# Patient Record
Sex: Male | Born: 1965 | Race: White | Hispanic: No | Marital: Married | Smoking: Current every day smoker
Health system: Southern US, Community
[De-identification: ages and names within clinical notes are randomized; demographics above are authoritative.]

## PROBLEM LIST (undated history)

## (undated) DIAGNOSIS — G4730 Sleep apnea, unspecified: Secondary | ICD-10-CM

## (undated) DIAGNOSIS — I10 Essential (primary) hypertension: Secondary | ICD-10-CM

## (undated) HISTORY — DX: Sleep apnea, unspecified: G47.30

## (undated) HISTORY — DX: Essential (primary) hypertension: I10

## (undated) HISTORY — PX: VASECTOMY: SHX75

---

## 2006-04-02 ENCOUNTER — Encounter: Admission: RE | Admit: 2006-04-02 | Discharge: 2006-04-02 | Payer: Self-pay | Admitting: Internal Medicine

## 2012-05-29 ENCOUNTER — Ambulatory Visit (INDEPENDENT_AMBULATORY_CARE_PROVIDER_SITE_OTHER): Payer: PRIVATE HEALTH INSURANCE | Admitting: Physician Assistant

## 2012-05-29 VITALS — BP 135/84 | HR 94 | Temp 97.4°F | Resp 20 | Ht 72.0 in | Wt 245.0 lb

## 2012-05-29 DIAGNOSIS — L03319 Cellulitis of trunk, unspecified: Secondary | ICD-10-CM

## 2012-05-29 DIAGNOSIS — L02212 Cutaneous abscess of back [any part, except buttock]: Secondary | ICD-10-CM

## 2012-05-29 DIAGNOSIS — L723 Sebaceous cyst: Secondary | ICD-10-CM

## 2012-05-29 MED ORDER — DOXYCYCLINE HYCLATE 100 MG PO CAPS: 100.0000 mg | ORAL_CAPSULE | Freq: Two times a day (BID) | ORAL | Status: DC

## 2012-05-29 NOTE — Progress Notes (Signed)
  Subjective:    Patient ID: Kenneth Koch, male    DOB: 02/10/1961, 47 y.o.   MRN: [id number]  HPI 47 year old male presents with 3-4 day history of irritated bump on his back.  States he has had a bump there for "years" but this week it has becoming increasingly irritated and painful.  Denies any drainage or warmth.  No fevers, chills, nausea, or vomiting. He is otherwise healthy with no other concerns today.      Review of Systems  Constitutional: Negative for fever and chills.  Gastrointestinal: Negative for nausea and vomiting.  Skin: Positive for wound.  Neurological: Negative for headaches.       Objective:   Physical Exam  Constitutional: He is oriented to person, place, and time. He appears well-developed and well-nourished.  HENT:  Head: Normocephalic and atraumatic.  Right Ear: External ear normal.  Left Ear: External ear normal.  Eyes: Conjunctivae are normal.  Cardiovascular: Normal rate.   Pulmonary/Chest: Effort normal.  Neurological: He is alert and oriented to person, place, and time.  Skin:     Psychiatric: He has a normal mood and affect. His behavior is normal. Judgment and thought content normal.      Patient ID: Kenneth Koch MRN: [id number], DOB: 02/10/1961, 47 y.o. Date of Encounter: 05/29/2012, 5:37 PM    PROCEDURE NOTE: Verbal consent obtained. Local anesthesia obtained with 2% lidocaine plain.  1 cm incision made with 11 blade along lesion.  Culture taken. Minimal purulence expressed. Copious sebaceous material expressed. Capsule removed.   Lesion explored revealing no loculations. Packed with 1/4 plain packing. Dressed. Wound care instructions including precautions with patient. Patient tolerated the procedure well. Recheck in 48 hours.       Grier Mitts, PA-C 05/29/2012 5:37 PM     Assessment & Plan:  1. Abscess of back - Plan: Wound culture, doxycycline (VIBRAMYCIN) 100 MG capsule  -Culture pending  -Doxycycline 100  mg bid x 10 days 2. Sebaceous cyst  -Placed on doxycycline due to minimal amount of purulence expressed  -Likely will only need 1 packing change but will determine based on recheck in 48 hours.

## 2012-05-31 ENCOUNTER — Ambulatory Visit (INDEPENDENT_AMBULATORY_CARE_PROVIDER_SITE_OTHER): Payer: PRIVATE HEALTH INSURANCE | Admitting: Physician Assistant

## 2012-05-31 ENCOUNTER — Encounter: Payer: Self-pay | Admitting: Physician Assistant

## 2012-05-31 VITALS — BP 153/102 | HR 84 | Temp 97.4°F | Resp 16 | Ht 72.0 in | Wt 246.0 lb

## 2012-05-31 DIAGNOSIS — L723 Sebaceous cyst: Secondary | ICD-10-CM

## 2012-05-31 NOTE — Progress Notes (Signed)
   Patient ID: Kenneth Koch MRN: [id number], DOB: May 05, 1965 48 y.o. Date of Encounter: 05/31/2012, 10:27 AM  Primary Physician: No primary Thea Holshouser on file.  Chief Complaint: Wound care   See previous note  HPI: 47 y.o. y/o male presents for wound care s/p I&D on 05/29/12 Doing well No issues or complaints Afebrile/ no chills No nausea or vomiting Tolerating Doxycycline Pain resolved "I have not even needed an ibuprofen." Daily dressing change Previous note reviewed  History reviewed. No pertinent past medical history.   Home Meds: Prior to Admission medications   Medication Sig Start Date End Date Taking? Authorizing Audra Kagel  doxycycline (VIBRAMYCIN) 100 MG capsule Take 1 capsule (100 mg total) by mouth 2 (two) times daily. 05/29/12  Yes Nelva Nay, PA-C    Allergies: No Known Allergies  ROS: Constitutional: Afebrile, no chills Cardiovascular: negative for chest pain or palpitations Dermatological: Positive for wound and erythema. Negative for pain GI: No nausea or vomiting   EXAM: Physical Exam: Blood pressure 153/102, pulse 84, temperature 97.4 F (36.3 C), temperature source Oral, resp. rate 16, height 6' (1.829 m), weight 246 lb (111.585 kg)., Body mass index is 33.36 kg/(m^2). General: Well developed, well nourished, in no acute distress. Nontoxic appearing. Head: Normocephalic, atraumatic, sclera non-icteric.  Neck: Supple. Lungs: Breathing is unlabored. Heart: Normal rate. Skin:  Warm and moist. Dressing and packing in place. Mild local induration and erythema without tenderness to palpation. Neuro: Alert and oriented X 3. Moves all extremities spontaneously. Normal gait.  Psych:  Responds to questions appropriately with a normal affect.   PROCEDURE: Dressing and packing removed. No purulence expressed Small amount of sebaceous material expressed Wound bed healthy Irrigated with 1% plain lidocaine 5 cc. Repacked with 1/4 inch plain packing    Dressing applied  LAB: Culture: preliminary, GPC in pairs   A/P: 47 y.o. male with cellulitis/abscess as above s/p I&D on 05/29/12 -Wound care per above -Continue Doxycycline -Pain well controlled -Daily dressing changes -Recheck 48 hours  Signed, Eula Listen, PA-C 05/31/2012 10:27 AM

## 2012-06-01 LAB — WOUND CULTURE: Gram Stain: NONE SEEN

## 2012-06-02 ENCOUNTER — Ambulatory Visit: Payer: PRIVATE HEALTH INSURANCE | Admitting: Physician Assistant

## 2012-06-02 VITALS — BP 120/88 | HR 99 | Temp 97.5°F | Resp 16 | Ht 72.0 in | Wt 245.0 lb

## 2012-06-02 NOTE — Progress Notes (Signed)
  Subjective:    Patient ID: Kenneth Koch, male    DOB: 09/30/1965, 47 y.o.   MRN: [id number]  HPI   Kenneth Koch is a very pleasant 47 yr old male here for follow-up on infected sebaceous cyst that was drained here 05/29/12.  Previous notes reviewed.  Pt states he is doing very well.  No discomfort.  Minimal drainage throughout the day.  He actually did not even have a dressing in place today.  Tolerating the antibiotic well.  No fever, chills.      Review of Systems  All other systems reviewed and are negative.       Objective:   Physical Exam  Vitals reviewed. Constitutional: He is oriented to person, place, and time. He appears well-developed and well-nourished. No distress.  HENT:  Head: Normocephalic and atraumatic.  Eyes: Conjunctivae are normal. No scleral icterus.  Pulmonary/Chest: Effort normal.  Neurological: He is alert and oriented to person, place, and time.  Skin: Skin is warm and dry.     Healing wound on left side of back  Psychiatric: He has a normal mood and affect. His behavior is normal.     Filed Vitals:   06/02/12 1820  BP: 120/88  Pulse: 99  Temp: 97.5 F (36.4 C)  Resp: 16     Wound Care: Packing removed, saturated with blood.  Only able to express a small amount of blood from the wound.  No purulence or sebaceous material expressed.  Wound healing well, very shallow, wound bed healthy.  I have not repacked.  Bandage applied.       Assessment & Plan:  Sebaceous cyst   Kenneth Koch is a very pleasant 47 yr old male here for f/u of sebaceous cyst drained her 05/29/12.  Pt is healing well.  I have not repacked the wound as it is shallow and healing well.  Dry dressing to wound daily until healed.  Encouraged pt to finish antibiotic, although wound cx grew multiple organisms, none predominant.  No need for further follow-up unless pt develops concerns.  Pt expressed understanding.

## 2012-09-30 NOTE — Progress Notes (Signed)
This encounter was created in error - please disregard.

## 2015-01-09 ENCOUNTER — Emergency Department (HOSPITAL_COMMUNITY): Payer: PRIVATE HEALTH INSURANCE

## 2015-01-09 ENCOUNTER — Emergency Department (HOSPITAL_COMMUNITY)
Admission: EM | Admit: 2015-01-09 | Discharge: 2015-01-10 | Disposition: A | Discharge status: Home / Self Care | Payer: PRIVATE HEALTH INSURANCE | Attending: Emergency Medicine | Admitting: Emergency Medicine

## 2015-01-09 ENCOUNTER — Encounter (HOSPITAL_COMMUNITY): Payer: Self-pay | Admitting: Emergency Medicine

## 2015-01-09 DIAGNOSIS — Z79899 Other long term (current) drug therapy: Secondary | ICD-10-CM | POA: Diagnosis not present

## 2015-01-09 DIAGNOSIS — R55 Syncope and collapse: Secondary | ICD-10-CM | POA: Insufficient documentation

## 2015-01-09 DIAGNOSIS — Z792 Long term (current) use of antibiotics: Secondary | ICD-10-CM | POA: Insufficient documentation

## 2015-01-09 DIAGNOSIS — Z720 Tobacco use: Secondary | ICD-10-CM | POA: Insufficient documentation

## 2015-01-09 DIAGNOSIS — J40 Bronchitis, not specified as acute or chronic: Secondary | ICD-10-CM | POA: Insufficient documentation

## 2015-01-09 DIAGNOSIS — Z23 Encounter for immunization: Secondary | ICD-10-CM | POA: Diagnosis not present

## 2015-01-09 DIAGNOSIS — S0003XA Contusion of scalp, initial encounter: Secondary | ICD-10-CM | POA: Insufficient documentation

## 2015-01-09 DIAGNOSIS — Y998 Other external cause status: Secondary | ICD-10-CM | POA: Diagnosis not present

## 2015-01-09 DIAGNOSIS — S80212A Abrasion, left knee, initial encounter: Secondary | ICD-10-CM | POA: Insufficient documentation

## 2015-01-09 DIAGNOSIS — Y9289 Other specified places as the place of occurrence of the external cause: Secondary | ICD-10-CM | POA: Insufficient documentation

## 2015-01-09 DIAGNOSIS — Y9389 Activity, other specified: Secondary | ICD-10-CM | POA: Insufficient documentation

## 2015-01-09 DIAGNOSIS — R05 Cough: Secondary | ICD-10-CM

## 2015-01-09 DIAGNOSIS — R058 Other specified cough: Secondary | ICD-10-CM

## 2015-01-09 DIAGNOSIS — W1839XA Other fall on same level, initial encounter: Secondary | ICD-10-CM | POA: Insufficient documentation

## 2015-01-09 DIAGNOSIS — S0990XA Unspecified injury of head, initial encounter: Secondary | ICD-10-CM | POA: Diagnosis not present

## 2015-01-09 LAB — BASIC METABOLIC PANEL
ANION GAP: 15 (ref 5–15)
BUN: 12 mg/dL (ref 6–20)
CALCIUM: 9.2 mg/dL (ref 8.9–10.3)
CHLORIDE: 96 mmol/L — AB (ref 101–111)
CO2: 22 mmol/L (ref 22–32)
Creatinine, Ser: 1.02 mg/dL (ref 0.61–1.24)
GFR calc non Af Amer: >60 mL/min (ref >60)
Glucose, Bld: 93 mg/dL (ref 65–99)
POTASSIUM: 3.6 mmol/L (ref 3.5–5.1)
Sodium: 133 mmol/L — ABNORMAL LOW (ref 135–145)

## 2015-01-09 LAB — URINALYSIS, ROUTINE W REFLEX MICROSCOPIC
BILIRUBIN URINE: NEGATIVE
Glucose, UA: NEGATIVE mg/dL
KETONES UR: NEGATIVE mg/dL
Leukocytes, UA: NEGATIVE
NITRITE: NEGATIVE
PH: 5.5 (ref 5.0–8.0)
Protein, ur: NEGATIVE mg/dL
SPECIFIC GRAVITY, URINE: 1.014 (ref 1.005–1.030)
UROBILINOGEN UA: 0.2 mg/dL (ref 0.0–1.0)

## 2015-01-09 LAB — CBC
HCT: 45 % (ref 39.0–52.0)
HEMOGLOBIN: 16 g/dL (ref 13.0–17.0)
MCH: 32.3 pg (ref 26.0–34.0)
MCHC: 35.6 g/dL (ref 30.0–36.0)
MCV: 90.9 fL (ref 78.0–100.0)
PLATELETS: 309 10*3/uL (ref 150–400)
RBC: 4.95 MIL/uL (ref 4.22–5.81)
RDW: 12.3 % (ref 11.5–15.5)
WBC: 10.1 10*3/uL (ref 4.0–10.5)

## 2015-01-09 LAB — URINE MICROSCOPIC-ADD ON

## 2015-01-09 NOTE — ED Notes (Addendum)
Pt. reports brief syncopal episode / fall this evening , presents with posterior neck pain , headache , left shoulder pain and left scalp swelling/redness . Alert and oriented , speech clear , equal strong grips /no arm drift . C- collar applied at triage . Pt. also reported productive cough /chest congestion onset this week .

## 2015-01-09 NOTE — ED Notes (Signed)
Triage nurse consulted EDP on pt.'s condition - CT scans ordered. Plan of care explained to pt. , delay and waiting time .

## 2015-01-10 ENCOUNTER — Emergency Department (HOSPITAL_COMMUNITY): Payer: PRIVATE HEALTH INSURANCE

## 2015-01-10 LAB — TROPONIN I: Troponin I: <0.03 ng/mL (ref <0.031)

## 2015-01-10 MED ORDER — TETANUS-DIPHTH-ACELL PERTUSSIS 5-2.5-18.5 LF-MCG/0.5 IM SUSP
0.5000 mL | Freq: Once | INTRAMUSCULAR | Status: AC
  Administered 2015-01-10: 0.5 mL via INTRAMUSCULAR
  Filled 2015-01-10: qty 0.5

## 2015-01-10 MED ORDER — ALBUTEROL SULFATE HFA 108 (90 BASE) MCG/ACT IN AERS: 2.0000 | INHALATION_SPRAY | RESPIRATORY_TRACT | Status: DC | PRN

## 2015-01-10 MED ORDER — OXYCODONE-ACETAMINOPHEN 5-325 MG PO TABS
1.0000 | ORAL_TABLET | Freq: Once | ORAL | Status: AC
  Administered 2015-01-10: 1 via ORAL
  Filled 2015-01-10: qty 1

## 2015-01-10 MED ORDER — PREDNISONE 20 MG PO TABS: 40.0000 mg | ORAL_TABLET | Freq: Every day | ORAL | Status: DC

## 2015-01-10 MED ORDER — OXYCODONE-ACETAMINOPHEN 5-325 MG PO TABS: 1.0000 | ORAL_TABLET | Freq: Four times a day (QID) | ORAL | Status: DC | PRN

## 2015-01-10 MED ORDER — ALBUTEROL SULFATE HFA 108 (90 BASE) MCG/ACT IN AERS
2.0000 | INHALATION_SPRAY | RESPIRATORY_TRACT | Status: DC | PRN
  Administered 2015-01-10: 2 via RESPIRATORY_TRACT
  Filled 2015-01-10: qty 6.7

## 2015-01-10 NOTE — Discharge Instructions (Signed)
You were seen today for passing out. You have no evidence of traumatic injury. You may have passed out because of her coughing spell. This is called vasovagal syncope. You do need to follow-up with cardiologist as soon as possible for further evaluation. He likely have bronchitis causing your cough. You'll be given an inhaler and still voids. See return precautions below.   Syncope Syncope is a medical term for fainting or passing out. This means you lose consciousness and drop to the ground. People are generally unconscious for less than 5 minutes. You may have some muscle twitches for up to 15 seconds before waking up and returning to normal. Syncope occurs more often in older adults, but it can happen to anyone. While most causes of syncope are not dangerous, syncope can be a sign of a serious medical problem. It is important to seek medical care.  CAUSES  Syncope is caused by a sudden drop in blood flow to the brain. The specific cause is often not determined. Factors that can bring on syncope include:  Taking medicines that lower blood pressure.  Sudden changes in posture, such as standing up quickly.  Taking more medicine than prescribed.  Standing in one place for too long.  Seizure disorders.  Dehydration and excessive exposure to heat.  Low blood sugar (hypoglycemia).  Straining to have a bowel movement.  Heart disease, irregular heartbeat, or other circulatory problems.  Fear, emotional distress, seeing blood, or severe pain. SYMPTOMS  Right before fainting, you may:  Feel dizzy or light-headed.  Feel nauseous.  See all white or all black in your field of vision.  Have cold, clammy skin. DIAGNOSIS  Your health care provider will ask about your symptoms, perform a physical exam, and perform an electrocardiogram (ECG) to record the electrical activity of your heart. Your health care provider may also perform other heart or blood tests to determine the cause of your  syncope which may include:  Transthoracic echocardiogram (TTE). During echocardiography, sound waves are used to evaluate how blood flows through your heart.  Transesophageal echocardiogram (TEE).  Cardiac monitoring. This allows your health care provider to monitor your heart rate and rhythm in real time.  Holter monitor. This is a portable device that records your heartbeat and can help diagnose heart arrhythmias. It allows your health care provider to track your heart activity for several days, if needed.  Stress tests by exercise or by giving medicine that makes the heart beat faster. TREATMENT  In most cases, no treatment is needed. Depending on the cause of your syncope, your health care provider may recommend changing or stopping some of your medicines. HOME CARE INSTRUCTIONS  Have someone stay with you until you feel stable.  Do not drive, use machinery, or play sports until your health care provider says it is okay.  Keep all follow-up appointments as directed by your health care provider.  Lie down right away if you start feeling like you might faint. Breathe deeply and steadily. Wait until all the symptoms have passed.  Drink enough fluids to keep your urine clear or pale yellow.  If you are taking blood pressure or heart medicine, get up slowly and take several minutes to sit and then stand. This can reduce dizziness. SEEK IMMEDIATE MEDICAL CARE IF:   You have a severe headache.  You have unusual pain in the chest, abdomen, or back.  You are bleeding from your mouth or rectum, or you have black or tarry stool.  You have  an irregular or very fast heartbeat.  You have pain with breathing.  You have repeated fainting or seizure-like jerking during an episode.  You faint when sitting or lying down.  You have confusion.  You have trouble walking.  You have severe weakness.  You have vision problems. If you fainted, call your local emergency services (911 in  [place] Do not drive yourself to the hospital.    This information is not intended to replace advice given to you by your health care provider. Make sure you discuss any questions you have with your health care provider.   Document Released: 03/18/2005 Document Revised: 08/02/2014 Document Reviewed: 05/17/2011 Elsevier Interactive Patient Education 2016 [employer] Acute Bronchitis Bronchitis is inflammation of the airways that extend from the windpipe into the lungs (bronchi). The inflammation often causes mucus to develop. This leads to a cough, which is the most common symptom of bronchitis.  In acute bronchitis, the condition usually develops suddenly and goes away over time, usually in a couple weeks. Smoking, allergies, and asthma can make bronchitis worse. Repeated episodes of bronchitis may cause further lung problems.  CAUSES Acute bronchitis is most often caused by the same virus that causes a cold. The virus can spread from person to person (contagious) through coughing, sneezing, and touching contaminated objects. SIGNS AND SYMPTOMS   Cough.   Fever.   Coughing up mucus.   Body aches.   Chest congestion.   Chills.   Shortness of breath.   Sore throat.  DIAGNOSIS  Acute bronchitis is usually diagnosed through a physical exam. Your health care provider will also ask you questions about your medical history. Tests, such as chest X-rays, are sometimes done to rule out other conditions.  TREATMENT  Acute bronchitis usually goes away in a couple weeks. Oftentimes, no medical treatment is necessary. Medicines are sometimes given for relief of fever or cough. Antibiotic medicines are usually not needed but may be prescribed in certain situations. In some cases, an inhaler may be recommended to help reduce shortness of breath and control the cough. A cool mist vaporizer may also be used to help thin bronchial secretions and make it easier to clear the chest.  HOME CARE  INSTRUCTIONS  Get plenty of rest.   Drink enough fluids to keep your urine clear or pale yellow (unless you have a medical condition that requires fluid restriction). Increasing fluids may help thin your respiratory secretions (sputum) and reduce chest congestion, and it will prevent dehydration.   Take medicines only as directed by your health care provider.  If you were prescribed an antibiotic medicine, finish it all even if you start to feel better.  Avoid smoking and secondhand smoke. Exposure to cigarette smoke or irritating chemicals will make bronchitis worse. If you are a smoker, consider using nicotine gum or skin patches to help control withdrawal symptoms. Quitting smoking will help your lungs heal faster.   Reduce the chances of another bout of acute bronchitis by washing your hands frequently, avoiding people with cold symptoms, and trying not to touch your hands to your mouth, nose, or eyes.   Keep all follow-up visits as directed by your health care provider.  SEEK MEDICAL CARE IF: Your symptoms do not improve after 1 week of treatment.  SEEK IMMEDIATE MEDICAL CARE IF:  You develop an increased fever or chills.   You have chest pain.   You have severe shortness of breath.  You have bloody sputum.   You develop dehydration.  You faint or repeatedly feel like you are going to pass out.  You develop repeated vomiting.  You develop a severe headache. MAKE SURE YOU:   Understand these instructions.  Will watch your condition.  Will get help right away if you are not doing well or get worse.   This information is not intended to replace advice given to you by your health care provider. Make sure you discuss any questions you have with your health care provider.   Document Released: 04/25/2004 Document Revised: 04/08/2014 Document Reviewed: 09/08/2012 Elsevier Interactive Patient Education [employer]

## 2015-01-10 NOTE — ED Provider Notes (Signed)
CSN: [id number]     Arrival date & time 01/09/15  2103 History  By signing my name below, I, Kenneth Koch, attest that this documentation has been prepared under the direction and in the presence of Kenneth Baton, MD. Electronically Signed: Lyndel Koch, ED Scribe. 01/10/2015. 12:18 AM.   Chief Complaint  Patient presents with  . Loss of Consciousness   The history is provided by the patient. No language interpreter was used.   HPI Comments: Kenneth Koch IV is a 49 y.o. male, who is placed in a C-collar, who presents to the Emergency Department  complaining of a syncopal episode that lasted for less than 1 minute PTA. He associates a 5/10 headache. Pt reports he was SOB when coughing and laughing when he lost consciousness and fell hitting his head on a concrete floor. No seizure activity noted. He did not fill dizzy. Patient reports similar syncopal event in the past associated with forcefully coughing and laughing. Pt also reports left shoulder pain and left knee pain attributable to the fall. He was ambulatory after LOC. Pt states he has been taking Nyquil this week for his cough. He is a current daily smoker but denies productivity of his cough. Also denies CP.  Denies fevers. He is not taking blood thinning medication. Denies neck pain.  History reviewed. No pertinent past medical history. Past Surgical History  Procedure Laterality Date  . Vasectomy     Family History  Problem Relation Age of Onset  . Hypertension Mother   . Diabetes Father    Social History  Substance Use Topics  . Smoking status: Current Every Day Smoker  . Smokeless tobacco: None  . Alcohol Use: Yes    Review of Systems  Constitutional: Negative for fever.  Respiratory: Positive for cough.   Cardiovascular: Negative for chest pain and leg swelling.  Gastrointestinal: Negative for nausea, vomiting and abdominal pain.  Skin: Positive for wound.  Neurological: Positive for syncope and headaches.  Negative for light-headedness.  All other systems reviewed and are negative.   Allergies  Review of patient's allergies indicates no known allergies.  Home Medications   Prior to Admission medications   Medication Sig Start Date End Date Taking? Authorizing Provider  albuterol (PROVENTIL HFA;VENTOLIN HFA) 108 (90 BASE) MCG/ACT inhaler Inhale 2 puffs into the lungs every 4 (four) hours as needed for wheezing or shortness of breath. 01/10/15   Kenneth Baton, MD  doxycycline (VIBRAMYCIN) 100 MG capsule Take 1 capsule (100 mg total) by mouth 2 (two) times daily. 05/29/12   Kenneth Nay, PA-C  oxyCODONE-acetaminophen (PERCOCET/ROXICET) 5-325 MG tablet Take 1 tablet by mouth every 6 (six) hours as needed for severe pain. 01/10/15   Kenneth Baton, MD  predniSONE (DELTASONE) 20 MG tablet Take 2 tablets (40 mg total) by mouth daily with breakfast. 01/10/15   Kenneth Baton, MD   BP 139/91 mmHg  Pulse 94  Temp(Src) 97.3 F (36.3 C) (Oral)  Resp 18  Ht 6' (1.829 m)  Wt 263 lb 6 oz (119.466 kg)  BMI 35.71 kg/m2  SpO2 97% Physical Exam  Constitutional: He is oriented to person, place, and time. He appears well-developed and well-nourished. No distress.  HENT:  Mouth/Throat: Oropharynx is clear and moist.  Hematoma noted just left the vertex with tenderness to palpation  Eyes: EOM are normal. Pupils are equal, round, and reactive to light.  Neck: Normal range of motion. Neck supple.  No midline tenderness to palpation, step-off or  deformity  Cardiovascular: Normal rate, regular rhythm and normal heart sounds.   No murmur heard. Pulmonary/Chest: Effort normal and breath sounds normal. No respiratory distress. He exhibits no tenderness.  Scant wheeze  Abdominal: Soft. Bowel sounds are normal. There is no tenderness. There is no rebound.  Musculoskeletal:  Tenderness palpation over the left humeral head, no obvious deformity, normal range of motion, tenderness with range of motion  of the left knee, no swelling or obvious deformity noted  Neurological: He is alert and oriented to person, place, and time.  Skin: Skin is warm and dry.  Abrasion noted to the left knee  Psychiatric: He has a normal mood and affect.  Nursing note and vitals reviewed.   ED Course  Procedures  DIAGNOSTIC STUDIES: Oxygen Saturation is 97% on RA, normal by my interpretation.    COORDINATION OF CARE: 12:16 AM Discussed treatment plan with pt at bedside and pt agreed to plan.   Labs Review Labs Reviewed  BASIC METABOLIC PANEL - Abnormal; Notable for the following:    Sodium 133 (*)    Chloride 96 (*)    All other components within normal limits  URINALYSIS, ROUTINE W REFLEX MICROSCOPIC (NOT AT [facility]) - Abnormal; Notable for the following:    APPearance CLOUDY (*)    Hgb urine dipstick SMALL (*)    All other components within normal limits  CBC  URINE MICROSCOPIC-ADD ON  TROPONIN I    Imaging Review Dg Chest 2 View  01/09/2015   CLINICAL DATA:  Cough and shortness of breath for 10 days. Syncope today.  EXAM: CHEST  2 VIEW  COMPARISON:  None.  FINDINGS: The heart size and mediastinal contours are within normal limits. Both lungs are clear. The visualized skeletal structures are unremarkable.  IMPRESSION: No active cardiopulmonary disease.   Electronically Signed   By: Burman Nieves M.D.   On: 01/09/2015 23:34   Ct Head Wo Contrast  01/09/2015   CLINICAL DATA:  Patient fell off of a bar stool in struck head. Large hematoma to the back of the head. Cold with cough.  EXAM: CT HEAD WITHOUT CONTRAST  CT CERVICAL SPINE WITHOUT CONTRAST  TECHNIQUE: Multidetector CT imaging of the head and cervical spine was performed following the standard protocol without intravenous contrast. Multiplanar CT image reconstructions of the cervical spine were also generated.  COMPARISON:  None.  FINDINGS: CT HEAD FINDINGS  Ventricles and sulci appear symmetrical. No mass effect or midline shift. No abnormal  extra-axial fluid collections. Gray-white matter junctions are distinct. Basal cisterns are not effaced. No evidence of acute intracranial hemorrhage. No depressed skull fractures. Mucosal thickening throughout the paranasal sinuses, most prominent in the left maxillary antrum. Partial opacification of some of the left mastoid air cells.  CT CERVICAL SPINE FINDINGS  Normal alignment of the cervical spine. No vertebral compression deformities. Intervertebral disc space heights are preserved. No focal bone lesion or bone destruction. Bone cortex and trabecular architecture appears intact. C1-2 articulation appears intact. Scattered lymph nodes in the cervical region are not pathologically enlarged.  IMPRESSION: No acute intracranial abnormalities. Normal alignment of the cervical spine. No acute displaced fractures identified.   Electronically Signed   By: Burman Nieves M.D.   On: 01/09/2015 23:44   Ct Cervical Spine Wo Contrast  01/09/2015   CLINICAL DATA:  Patient fell off of a bar stool in struck head. Large hematoma to the back of the head. Cold with cough.  EXAM: CT HEAD WITHOUT CONTRAST  CT CERVICAL  SPINE WITHOUT CONTRAST  TECHNIQUE: Multidetector CT imaging of the head and cervical spine was performed following the standard protocol without intravenous contrast. Multiplanar CT image reconstructions of the cervical spine were also generated.  COMPARISON:  None.  FINDINGS: CT HEAD FINDINGS  Ventricles and sulci appear symmetrical. No mass effect or midline shift. No abnormal extra-axial fluid collections. Gray-white matter junctions are distinct. Basal cisterns are not effaced. No evidence of acute intracranial hemorrhage. No depressed skull fractures. Mucosal thickening throughout the paranasal sinuses, most prominent in the left maxillary antrum. Partial opacification of some of the left mastoid air cells.  CT CERVICAL SPINE FINDINGS  Normal alignment of the cervical spine. No vertebral compression  deformities. Intervertebral disc space heights are preserved. No focal bone lesion or bone destruction. Bone cortex and trabecular architecture appears intact. C1-2 articulation appears intact. Scattered lymph nodes in the cervical region are not pathologically enlarged.  IMPRESSION: No acute intracranial abnormalities. Normal alignment of the cervical spine. No acute displaced fractures identified.   Electronically Signed   By: Burman Nieves M.D.   On: 01/09/2015 23:44   Dg Shoulder Left  01/10/2015   CLINICAL DATA:  Shoulder pain.  EXAM: LEFT SHOULDER - 2+ VIEW  COMPARISON:  None.  FINDINGS: There is no evidence of fracture or dislocation. There is no evidence of arthropathy or other focal bone abnormality. Soft tissues are unremarkable.  IMPRESSION: Negative.   Electronically Signed   By: Burman Nieves M.D.   On: 01/10/2015 00:40   I have personally reviewed and evaluated these images and lab results as part of my medical decision-making.   EKG Interpretation   Date/Time:  Monday January 09 2015 21:35:02 EDT Ventricular Rate:  99 PR Interval:  142 QRS Duration: 98 QT Interval:  366 QTC Calculation: 469 R Axis:   -18 Text Interpretation:  Normal sinus rhythm Incomplete right bundle branch  block Borderline ECG No prior for comparison Confirmed by Cheney Gosch  MD,  Marcel Gary ([id number]) on 01/10/2015 12:09:23 AM      MDM   Final diagnoses:  Vasovagal syncope  Bronchitis    Patient presents with syncope and fall. Syncope in the setting of aggressive coughing and laughing. Reports history of the same. Nontoxic on exam. Contusion to the left scalp, abrasion to the knee and left shoulder pain. Plain films obtained in reassuring.  Given cough and shortness of breath prior to syncope, EKG, troponin obtained and reassuring. Patient is not orthostatic. He was given pain medication. Suspect vasovagal syncope as the cause of the patient's symptoms. He does not have any evidence of traumatic injury.   He has no fevers but is a smoker and has scant wheezing on exam. Will treat for bronchitis. Will also have patient follow-up with cardiology for formal evaluation given her recurrent syncope.  After history, exam, and medical workup I feel the patient has been appropriately medically screened and is Koch for discharge home. Pertinent diagnoses were discussed with the patient. Patient was given return precautions.   I personally performed the services described in this documentation, which was scribed in my presence. The recorded information has been reviewed and is accurate.   Kenneth Baton, MD 01/10/15 [phone]

## 2016-05-28 IMAGING — CT CT CERVICAL SPINE W/O CM
4 of 6 series · 14 of 33 positions shown, 16 images · non-contrast
Comparison: None.

CLINICAL DATA: Patient fell off of a bar stool in struck head.
Large hematoma to the back of the head. Cold with cough.

EXAM:
CT HEAD WITHOUT CONTRAST
CT CERVICAL SPINE WITHOUT CONTRAST
TECHNIQUE: Multidetector CT imaging of the head and cervical spine was
performed following the standard protocol without intravenous
contrast. Multiplanar CT image reconstructions of the cervical spine
were also generated.

[Series 302: soft tissue, idose (2) · axial · 0.39mm/px · z∈[+118,+234]mm · 3 of 117 slices shown]
[im 30/117  soft-tissue]
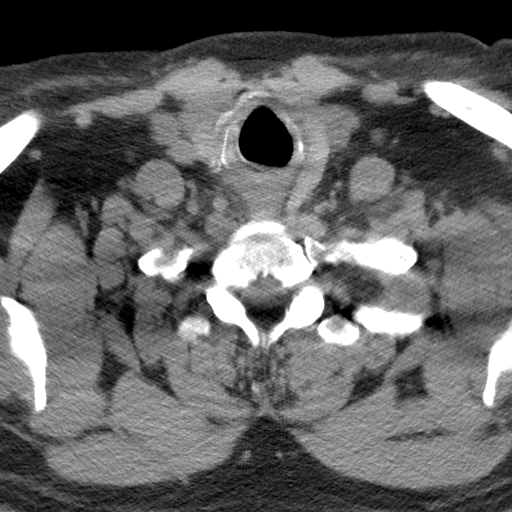
[im 59/117  soft-tissue]
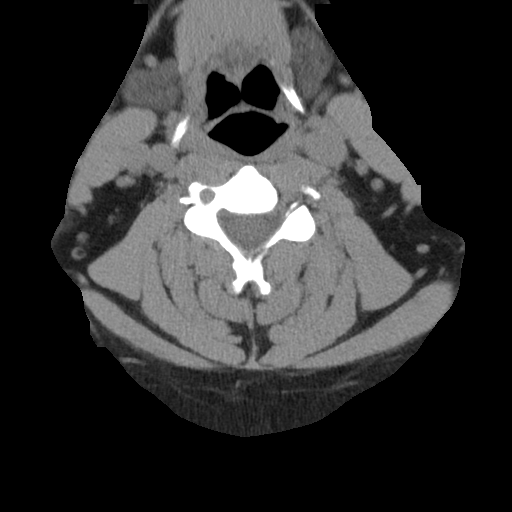
[im 88/117  soft-tissue]
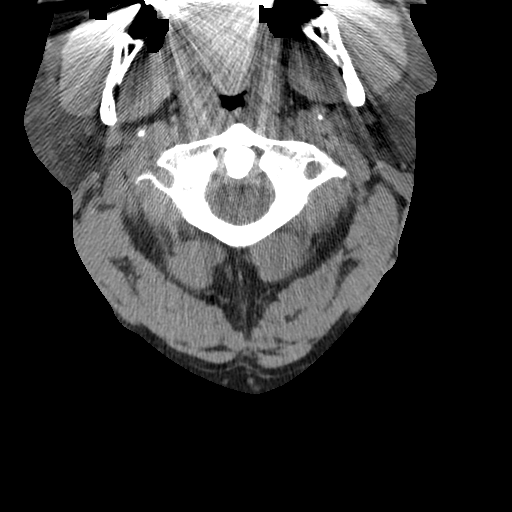

[Series 304: coronal, idose (2) · coronal · 0.37mm/px · 3 of 87 slices shown]
[im 26/87  bone]
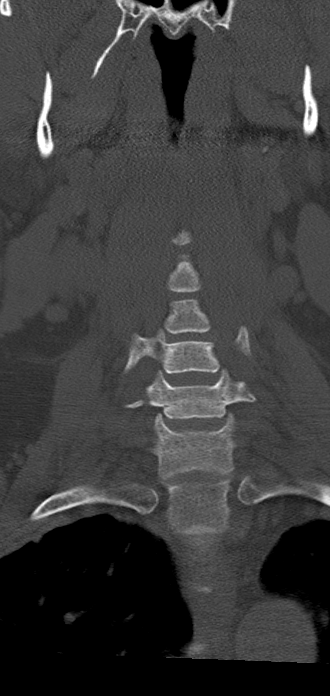
[im 38/87  bone]
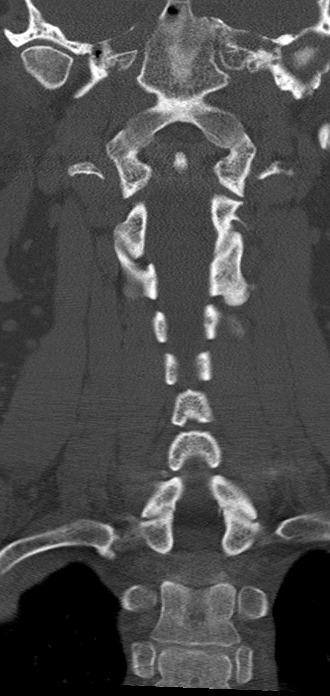
[im 50/87  bone]
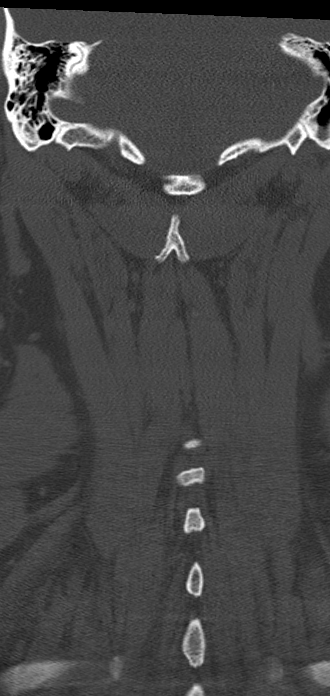

[Series 305: sagittal, idose (2) · sagittal · 0.37mm/px · 5 of 59 slices shown, 6 images]
[im 20/59  bone]
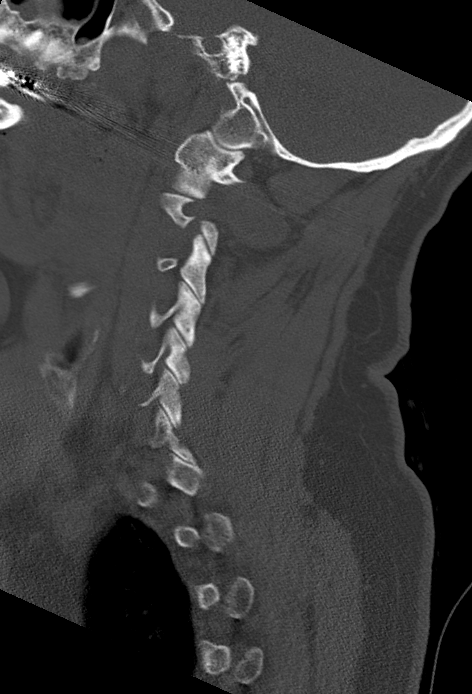
[im 25/59  bone]
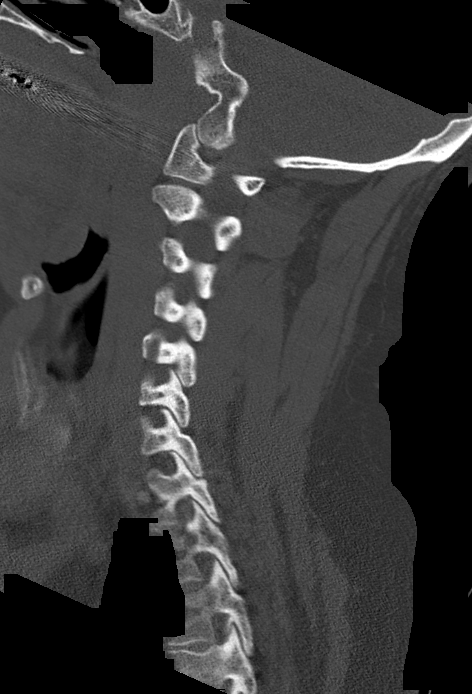
[im 30/59  soft-tissue]
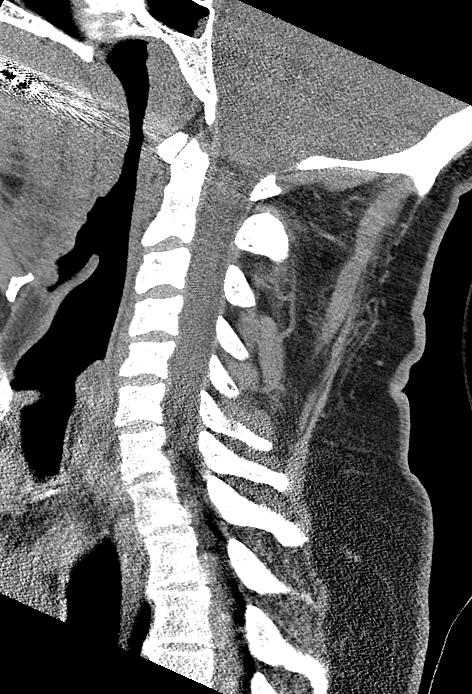
[im 30/59  bone]
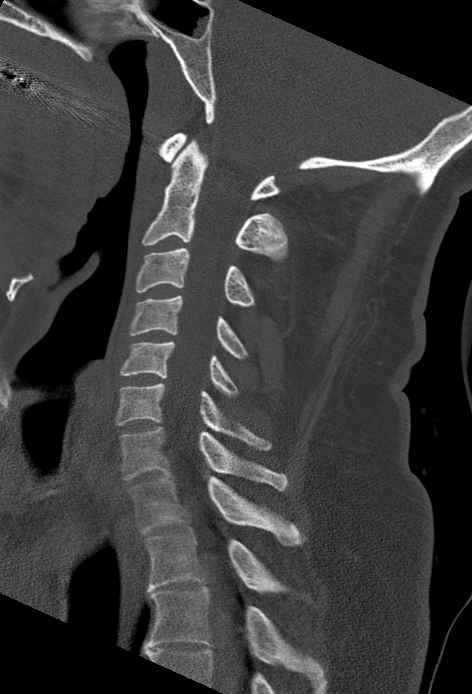
[im 34/59  bone]
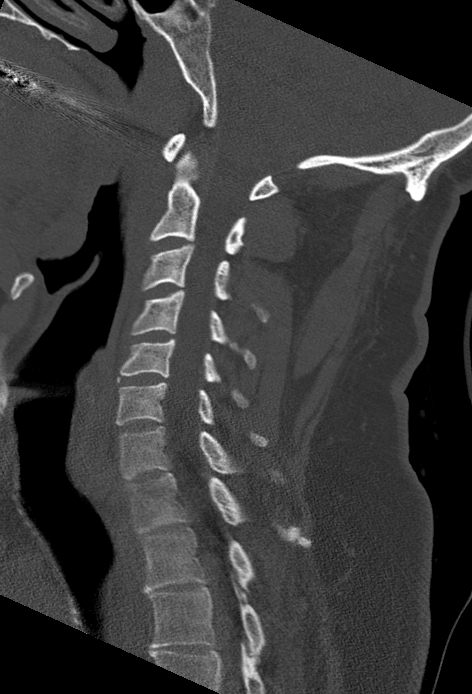
[im 39/59  bone]
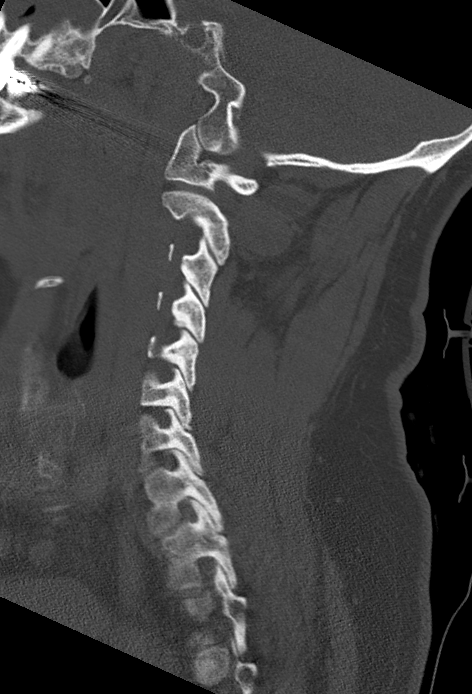

[Series 306: orthogonals, idose (2) · axial · 0.52mm/px · z∈[+80,+197]mm · 3 of 128 slices shown, 4 images]
[im 32/128  soft-tissue]
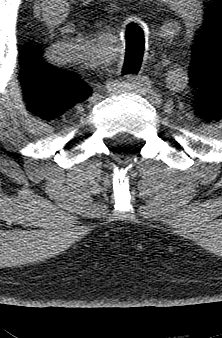
[im 32/128  bone]
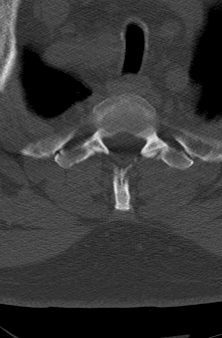
[im 64/128  bone]
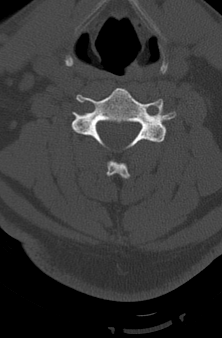
[im 96/128  bone]
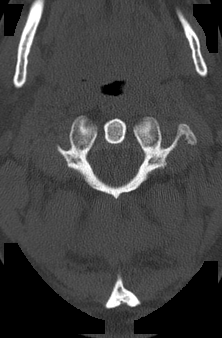

[14 of 33 positions shown; findings below may reference images not displayed]

FINDINGS: CT HEAD FINDINGS

Ventricles and sulci appear symmetrical. No mass effect or midline
shift. No abnormal extra-axial fluid collections. Gray-white matter
junctions are distinct. Basal cisterns are not effaced. No evidence
of acute intracranial hemorrhage. No depressed skull fractures.
Mucosal thickening throughout the paranasal sinuses, most prominent
in the left maxillary antrum. Partial opacification of some of the
left mastoid air cells.

CT CERVICAL SPINE FINDINGS

Normal alignment of the cervical spine. No vertebral compression
deformities. Intervertebral disc space heights are preserved. No
focal bone lesion or bone destruction. Bone cortex and trabecular
architecture appears intact. C1-2 articulation appears intact.
Scattered lymph nodes in the cervical region are not pathologically
enlarged.
IMPRESSION: No acute intracranial abnormalities. Normal alignment of the
cervical spine. No acute displaced fractures identified.

## 2016-05-29 IMAGING — DX DG SHOULDER 2+V*L*
2 series · 2 of 2 positions shown · non-contrast
Comparison: None.

CLINICAL DATA: Shoulder pain.

EXAM:
LEFT SHOULDER - 2+ VIEW

[shoulder grashey]
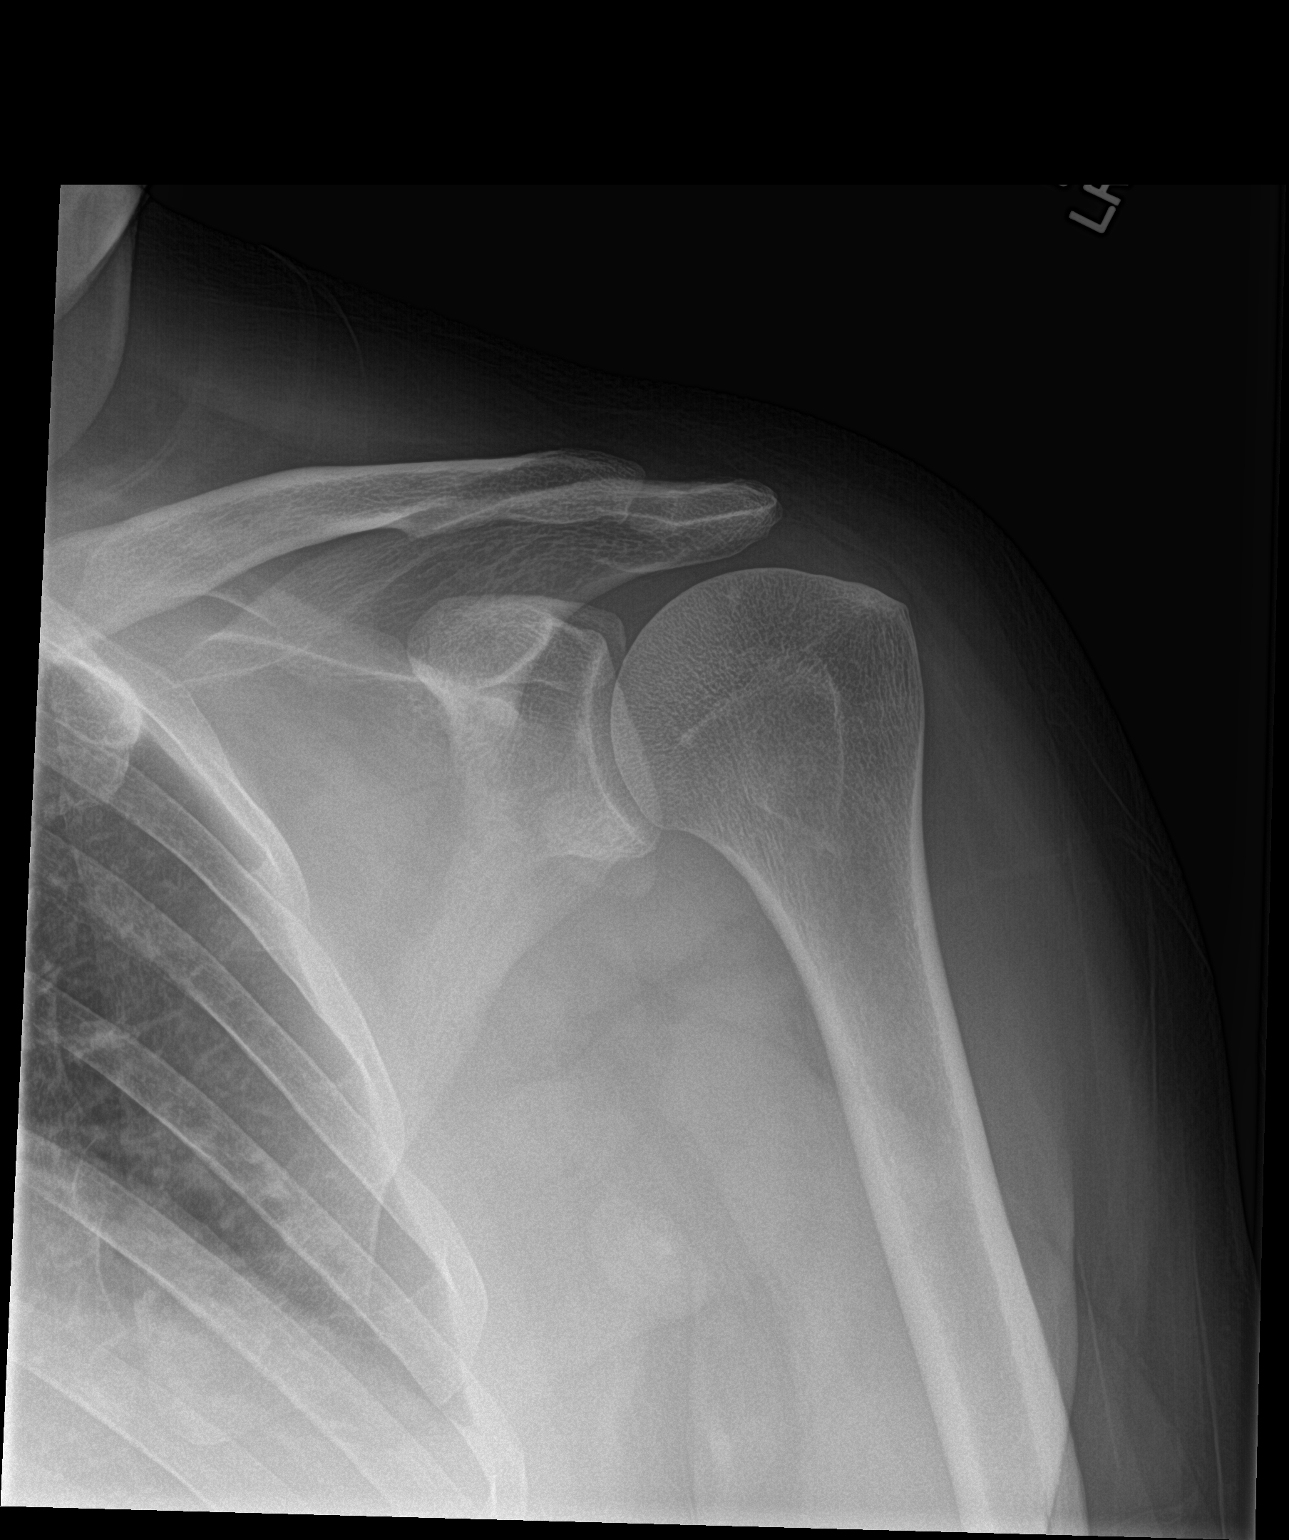

[shoulder y view]
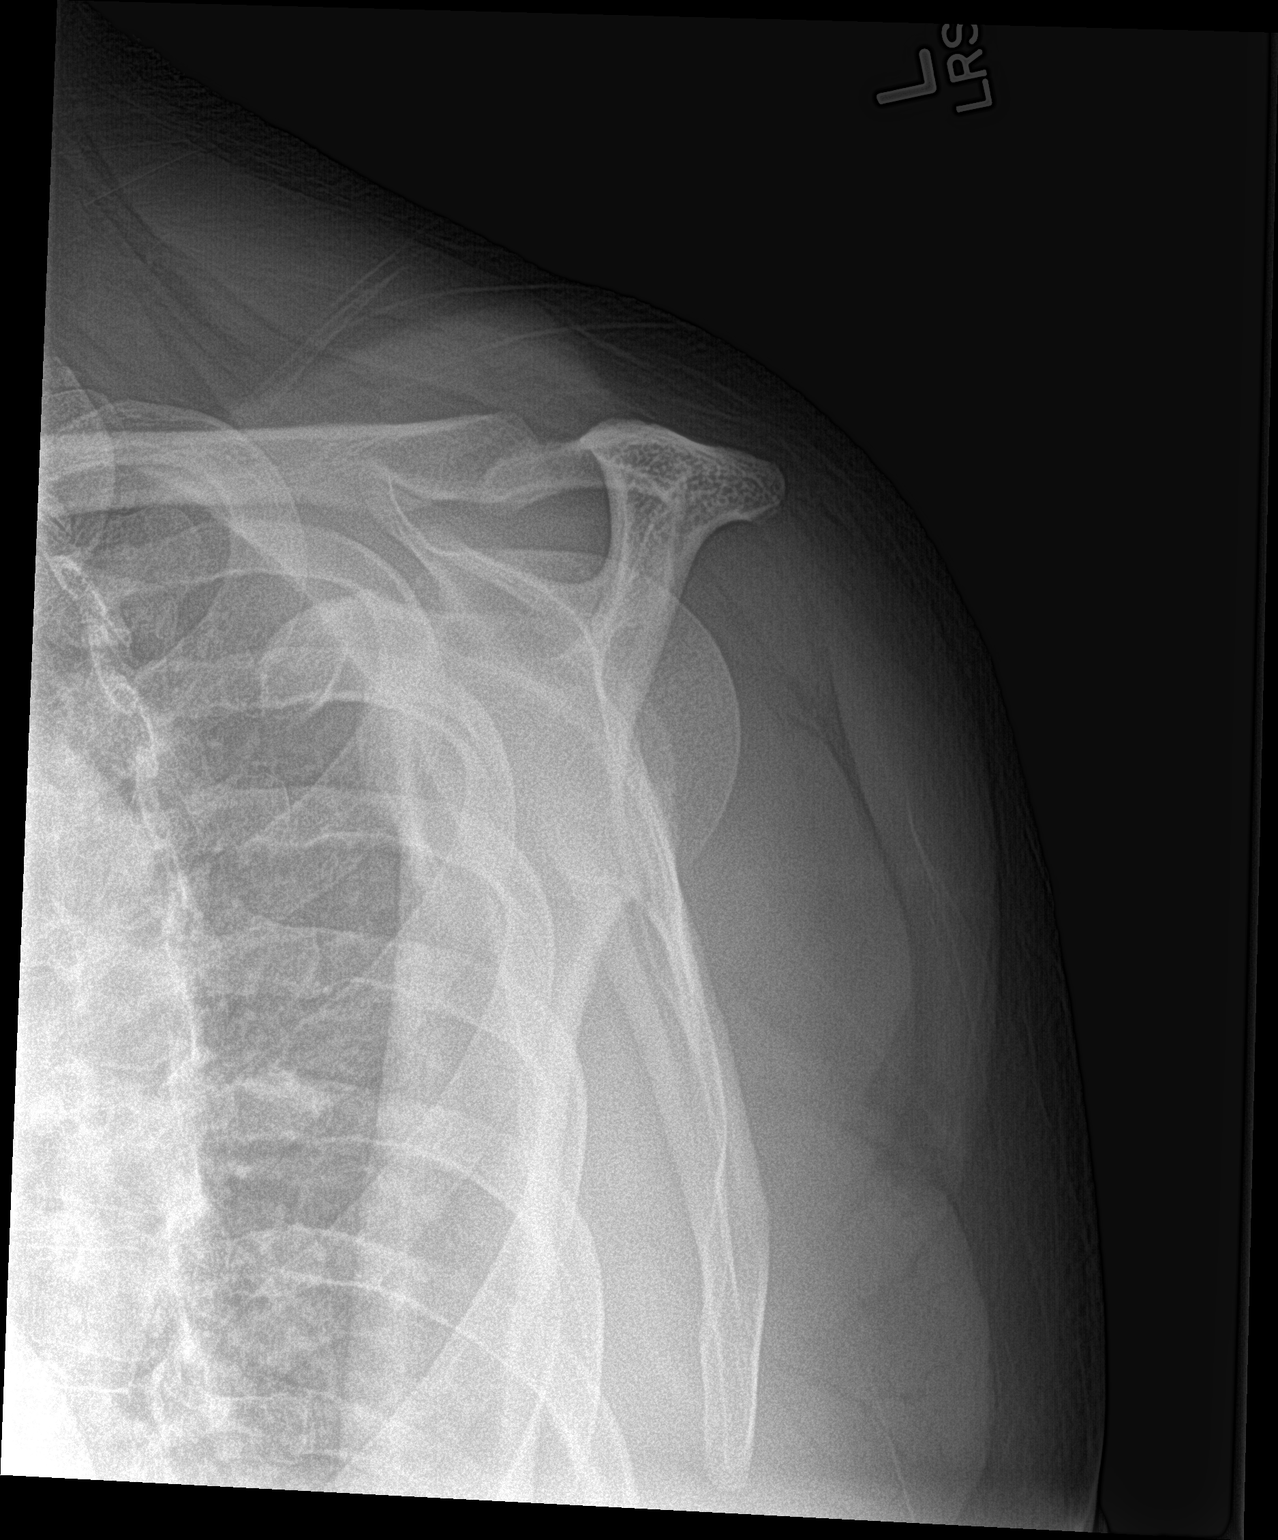

[2 of 2 positions shown; findings below may reference images not displayed]

FINDINGS: There is no evidence of fracture or dislocation. There is no
evidence of arthropathy or other focal bone abnormality. Soft
tissues are unremarkable.
IMPRESSION: Negative.

## 2018-04-08 ENCOUNTER — Encounter: Payer: Self-pay | Admitting: Neurology

## 2018-04-09 ENCOUNTER — Ambulatory Visit: Payer: 59 | Admitting: Neurology

## 2018-04-09 ENCOUNTER — Encounter: Payer: Self-pay | Admitting: Neurology

## 2018-04-09 VITALS — BP 132/94 | HR 77 | Ht 72.0 in | Wt 265.0 lb

## 2018-04-09 DIAGNOSIS — R0689 Other abnormalities of breathing: Secondary | ICD-10-CM | POA: Diagnosis not present

## 2018-04-09 DIAGNOSIS — G4731 Primary central sleep apnea: Secondary | ICD-10-CM

## 2018-04-09 DIAGNOSIS — G4733 Obstructive sleep apnea (adult) (pediatric): Secondary | ICD-10-CM

## 2018-04-09 DIAGNOSIS — J9612 Chronic respiratory failure with hypercapnia: Secondary | ICD-10-CM

## 2018-04-09 DIAGNOSIS — J9611 Chronic respiratory failure with hypoxia: Secondary | ICD-10-CM | POA: Diagnosis not present

## 2018-04-09 DIAGNOSIS — G4739 Other sleep apnea: Secondary | ICD-10-CM

## 2018-04-09 DIAGNOSIS — J449 Chronic obstructive pulmonary disease, unspecified: Secondary | ICD-10-CM

## 2018-04-09 MED ORDER — TRIAMCINOLONE ACETONIDE 55 MCG/ACT NA AERO: 2.0000 | INHALATION_SPRAY | Freq: Every day | NASAL | 1 refills | Status: DC

## 2018-04-09 NOTE — Progress Notes (Addendum)
SLEEP MEDICINE CLINIC   Provider:  Larey Seat, MD   Primary Care Physician:  Prince Solian, MD   Referring Provider: Prince Solian, MD    Chief Complaint  Patient presents with  . New Patient (Initial Visit)    pt alone, rm 10. pt states that he is not able to sleep. he had a sleep study completed through [place] 2010. he attempted CPAP at that time and lasted about a month with it and turned back in and never followed up. pt states ready to re try all this because he is running out of options and in need in energy. he complains of daytime sleepiness. he does admit to snoring and apnea in sleep.     HPI:  Kenneth Koch is a 53 y.o. male patient  , seen here as in a referral  from Dr. Dagmar Hait for a new sleep evaluation.  I had the pleasure of seeing him today on 09 April 2018, the last time we met was on 16 June 2008 and a sleep consultation.  At the time Kenneth Koch presented as a 53 year old Caucasian right-handed male, owner of a small business, who underwent a sleep study at [facility].  The study was split as his apnea index exceeded 100 an hour.  CPAP was initiated but cost central apneas to emerge.  BiPAP was therefore used and the best result was achieved at 19/15 centimeters water pressure.  The patient did initially use the machine the longest time he could tolerated however was only about 3 hours at night.  He felt that he got some sleep quality but it bothered him in the early morning hours to have the interface and his sleep became more and more fragmented after that we also discussed his alcohol intake at the time close to bedtime, having a possible impact.  He had also overcome a recent ear infection and a viral cold but may have made it harder for him to generate positive airway pressure.  He endorsed the Epworth sleepiness score at 14 points which was an elevated level of daytime sleepiness.  In the meantime his daytime sleepiness has exacerbated, he  continues to smoke, he has not lost a significant amount of weight, and his work is now affected by his daytime sleepiness.  His primary care physician sent him back as he now has a body mass index exceeding 35.94 kg/m, has almost constant wheezing and shortness of breath even with minimal exertion, he reports disruptive sleep with significant snoring now. EPWORTH SLEEPINESS SCORE is 21/ 254 points !  FSS 36/63.    Sleep habits are as follows: Dinner time averages 8 Pm, and he goes to  bed at midnight, in a cold, quiet and dark bedroom.   He sleeps on a stack of 2 pillowes with elevated head of bed. Dreams well.   Sleep medical history: frequent Colds, but not bronchitis, morning phlegm with coughing.  Borderline HTN. Tonsils still in situ, " spot on the tongue " biopsy negative. He stated he is not having emphysema. Retrognathia. Nicotine dependence.   Family sleep history: father is a loud snorer.   Social history: Separated after 28 years of marriage, likely to end in divorce, 2 sons age 42 and 75 years old.  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   Review of Systems: Out of a complete 14 system review, the patient complains of only the following symptoms, and all other  reviewed systems are negative.  Tachycardia. tachypnoea while in exam.     Epworth Sleepiness score: see above  , Fatigue severity score   , depression score.   Social History   Socioeconomic History  . Marital status: Married    Spouse name: Not on file  . Number of children: Not on file  . Years of education: Not on file  . Highest education level: Not on file  Occupational History  . Not on file  Social Needs  . Financial resource strain: Not on file  . Food insecurity:    Worry: Not on file    Inability: Not on file  . Transportation needs:    Medical: Not on file    Non-medical: Not on file  Tobacco Use  . Smoking status: Current Every Day Smoker    Packs/day: 1.00     Types: Cigarettes  . Smokeless tobacco: Former Network engineer and Sexual Activity  . Alcohol use: Yes    Alcohol/week: 12.0 standard drinks    Types: 12 Cans of beer per week  . Drug use: No  . Sexual activity: Yes    Birth control/protection: None  Lifestyle  . Physical activity:    Days per week: Not on file    Minutes per session: Not on file  . Stress: Not on file  Relationships  . Social connections:    Talks on phone: Not on file    Gets together: Not on file    Attends religious service: Not on file    Active member of club or organization: Not on file    Attends meetings of clubs or organizations: Not on file    Relationship status: Not on file  . Intimate partner violence:    Fear of current or ex partner: Not on file    Emotionally abused: Not on file    Physically abused: Not on file    Forced sexual activity: Not on file  Other Topics Concern  . Not on file  Social History Narrative  . Not on file    Family History  Problem Relation Age of Onset  . Hypertension Mother   . Hyperlipidemia Mother   . Transient ischemic attack Mother   . Diabetes Father   . Hyperlipidemia Father   . Cataracts Father   . Hypertension Father     Past Medical History:  Diagnosis Date  . Hypertension   . Sleep apnea     Past Surgical History:  Procedure Laterality Date  . VASECTOMY      Current Outpatient Medications  Medication Sig Dispense Refill  . Varenicline Tartrate (CHANTIX PO) Take by mouth.     No current facility-administered medications for this visit.     Allergies as of 04/09/2018  . (No Known Allergies)    Vitals: BP (!) 132/94   Pulse 77   Ht 6' (1.829 m)   Wt 265 lb (120.2 kg)   BMI 35.94 kg/m  Last Weight:  Wt Readings from Last 1 Encounters:  04/09/18 265 lb (120.2 kg)   DHR:CBUL mass index is 35.94 kg/m.      Last Height:   Ht Readings from Last 1 Encounters:  04/09/18 6' (1.829 m)    Physical exam:  General: The patient is  awake, alert and appears not in acute distress. The patient is well groomed. Head: Normocephalic, atraumatic. Neck is supple. Mallampati 24- uvula does not elevate.  neck circumference:19"  Nasal airflow congested. Retrognathia is seen, grade 2 .  b nasal septum deviation , status post nasal fracture.   Cardiovascular:  Regular rate and rhythm, without  murmurs or carotid bruit, and without distended neck veins. Respiratory: Lungs are wheezing, and there are rhonci to auscultation. Skin:   evidence of foot edema,, ankle edema.  Trunk: BMI is 36 . The patient's posture is stooped.    Neurologic exam : The patient is awake and alert, oriented to place and time.    Attention span & concentration ability appears normal.  Speech is fluent interrupted by SOB-  dysarthria, dysphonia or aphasia.  Mood and affect are appropriate.  Cranial nerves: Pupils are equal and briskly reactive to light. Funduscopic exam without evidence of pallor or edema. Extraocular movements  in vertical and horizontal planes intact and without nystagmus.  Visual fields by finger perimetry are intact. Hearing to finger rub intact.   Facial sensation intact to fine touch.  Facial motor strength is symmetric and tongue and uvula move midline. Shoulder shrug was symmetrical.   Motor exam:   Normal tone, muscle bulk and symmetric strength in all extremities.  Sensory:  Fine touch, pinprick and vibration were tested in all extremities. Proprioception tested in the upper extremities was normal.  Coordination: Rapid alternating movements in the fingers/hands was normal.  Finger-to-nose maneuver  normal without evidence of ataxia, dysmetria or tremor.  Gait and station: Patient walks without assistive device and is able unassisted to climb up to the exam table. Strength within normal limits.  Stance is stable and normal.  Toe and heel stand were tested . Deep tendon reflexes: in the  upper and lower extremities are symmetric  and intact. Babinski maneuver response is  downgoing.  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 Tachypnea. Tachycardia.  Snoring and sleep choking through the night- needs sleep apnea evaluation with capnography and hypoxemia watch/ . There is obesity now, still alcohol use,  Retrognathia and nasal congestion with nasal septal deviation - all making the OSA more complicated to treat. He may again not tolerate CPAP and BiPAP, may need VPAP- see treatment emergent central apneas in 2010.   2) Stress contributing to insomnia, to problems with sleep - impending divorce.   3) Nicotine abuse. He is finally willing to quit smoking.   40 he learned that he may use the INSPIRE device if PAP is not an option/ not tolerated.    The patient was advised of the nature of the diagnosed disorder , the treatment options and the  risks for general health and wellness arising from not treating the condition.   I spent more than 50 minutes of face to face time with the patient.  Greater than 50% of time was spent in counseling and coordination of care. We have discussed the diagnosis and differential and I answered the patient's questions.    Plan:  Treatment plan and additional workup : Smoking cessation Weight loss  OSA with COPD- attended sleep study is needed for capnography and history of central sleep apnea.  Nasocort - prior to sleep , use daily.  explained alternative therapies such as inspire,  and will ask for an adjustable bed the night of his test.     Kitara Hebb  Yacine Garriga, MD 09/01/3352, 56:25 AM  Certified in Neurology by ABPN Certified in [place] by  [facility] [address], [place], [place] [address]

## 2018-05-01 ENCOUNTER — Ambulatory Visit (INDEPENDENT_AMBULATORY_CARE_PROVIDER_SITE_OTHER): Payer: 59 | Admitting: Neurology

## 2018-05-01 DIAGNOSIS — J9612 Chronic respiratory failure with hypercapnia: Secondary | ICD-10-CM

## 2018-05-01 DIAGNOSIS — R0689 Other abnormalities of breathing: Secondary | ICD-10-CM

## 2018-05-01 DIAGNOSIS — J9611 Chronic respiratory failure with hypoxia: Secondary | ICD-10-CM

## 2018-05-01 DIAGNOSIS — G4733 Obstructive sleep apnea (adult) (pediatric): Secondary | ICD-10-CM

## 2018-05-01 DIAGNOSIS — G4731 Primary central sleep apnea: Secondary | ICD-10-CM

## 2018-05-01 DIAGNOSIS — J449 Chronic obstructive pulmonary disease, unspecified: Principal | ICD-10-CM

## 2018-05-01 DIAGNOSIS — G4739 Other sleep apnea: Secondary | ICD-10-CM

## 2018-05-08 DIAGNOSIS — J449 Chronic obstructive pulmonary disease, unspecified: Principal | ICD-10-CM

## 2018-05-08 DIAGNOSIS — J9612 Chronic respiratory failure with hypercapnia: Secondary | ICD-10-CM

## 2018-05-08 DIAGNOSIS — J9611 Chronic respiratory failure with hypoxia: Secondary | ICD-10-CM | POA: Insufficient documentation

## 2018-05-08 DIAGNOSIS — G4733 Obstructive sleep apnea (adult) (pediatric): Secondary | ICD-10-CM | POA: Insufficient documentation

## 2018-05-08 DIAGNOSIS — R0689 Other abnormalities of breathing: Secondary | ICD-10-CM | POA: Insufficient documentation

## 2018-05-08 NOTE — Procedures (Signed)
PATIENT'S NAME:  Kenneth Koch, Kenneth Koch DOB:      [birth year]      MR#:    [id number]     DATE OF RECORDING: 05/01/2018 REFERRING M.D.:  Prince Solian MD Study Performed:  Split-Night Titration Study HISTORY:  [name]. [name] is a 53 y.o. male patient seen on 09 April 2018, the last time we met was on 16 June 2008 in a sleep consultation.  At the time 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  The study was split as his apnea index exceeded 100/ hour. CPAP was initiated but caused central apneas to emerge.  BiPAP was therefore used and the best result was achieved at 19/15 centimeters water pressure.  The patient did tolerate the BiPAP machine only about 3 hours at night.  He felt that he got some quality sleep but the mask bothered him in the early morning hours and his sleep became more and more fragmented. In the meantime his daytime sleepiness has exacerbated, he continues to smoke, he has not lost a significant amount of weight, and his work is now affected by his daytime sleepiness.  His primary care physician sent him back as he now has a body mass index 35.94 kg/m, has almost constant wheezing and shortness of breath even with minimal exertion, he reports disruptive sleep with significantly loud snoring now. He sleeps on a stack of 2 pillows, elevated head of bed.   EPWORTH SLEEPINESS SCORE is 21/ 24 points! FSS endorsed at 36/63 points.    The patient's weight 265 pounds with a height of 72 (inches), resulting in a BMI of 35.8 kg/m2. The patient's neck circumference measured 19 inches.  CURRENT MEDICATIONS: Chantix  PROCEDURE:  This is a multichannel digital polysomnogram utilizing the Somnostar 11.2 system.  Electrodes and sensors were applied and monitored per AASM Specifications.   EEG, EOG, Chin and Limb EMG, were sampled at 200 Hz.  ECG, Snore and Nasal Pressure, Thermal  Airflow, Respiratory Effort, CPAP Flow and Pressure, Oximetry was sampled at 50 Hz. Digital video and audio were recorded.      BASELINE STUDY WITHOUT CPAP RESULTS: Lights Out was at 21:37 and Lights On at 05:00.  Total recording time (TRT) was 100.5, with a total sleep time (TST) of 76 minutes.  The patient's sleep latency was 19.5 minutes. The sleep efficiency was 75.6 %.    SLEEP ARCHITECTURE: WASO (Wake after sleep onset) was 6.5 minutes, Stage N1 was 30 minutes, Stage N2 was 15 minutes, Stage N3 was 31 minutes and Stage R (REM sleep) was 0 minutes.  The percentages were Stage N1 39.5%, Stage N2 19.7%, Stage N3 40.8% and Stage R (REM sleep) 0%.  RESPIRATORY ANALYSIS:  There were a total of 140 respiratory events:  0 apneas and 140 hypopneas with many respiratory event related arousals (RERAs).  Loud Snoring was noted.    The total APNEA/HYPOPNEA INDEX (AHI) was 110.5 /hour.  0 events occurred in REM sleep and 280 events in NREM. The REM AHI was 0.0/h, /hour versus a non-REM AHI of 110.5 /hour. The patient spent 59.5 minutes sleep time in the supine position 338 minutes in non-supine. The supine AHI was 0.0 /hour versus a non-supine AHI of 110.5 /hour.  OXYGEN SATURATION & C02:  The wake baseline 02 saturation was 95%, with the lowest being 75%. Time spent below 89% saturation equaled 74 minutes.  AROUSALS: The arousals were noted as: 5 were spontaneous, 0 were associated with PLMs, and 84 were associated with respiratory events.  The patient had a total of 0 Periodic Limb Movements.   Audio and video analysis did not show any abnormal or unusual movements, behaviors, phonations or vocalizations. The patient took bathroom breaks Snoring was noted EKG was in keeping with normal sinus rhythm (NSR)    TITRATION STUDY WITH CPAP RESULTS:   CPAP was initiated at 5 cmH20 with heated humidity per AASM split night standards and pressure was advanced to 13 cmH20 because of hypopneas, apneas and  desaturations.  At a PAP pressure of 13 cmH20, there was a reduction of the AHI to 0.0 /hour. A Full Face Mask was used due to patient nasal congestion, the ResMed model AirFit F 30 in medium size. The patient slept for 61 minutes under that pressure setting, Nadir 02 was 90%.   Total recording time (TRT) was 343 minutes, with a total sleep time (TST) of 321 minutes. The patient's sleep latency was 14 minutes. REM latency was 26.5 minutes.  The sleep efficiency was 93.6 %.   SLEEP ARCHITECTURE: Wake after sleep was 11.5 minutes, Stage N1 15.5 minutes, Stage N2 20 minutes, Stage N3 102.5 minutes and Stage R (REM sleep) 183 minutes. The percentages were: Stage N1 4.8%, Stage N2 6.2%, Stage N3 31.9% and Stage R (REM sleep) 57.%. The sleep architecture was notable for massive REM sleep rebound  RESPIRATORY ANALYSIS:  There were a total of 8 respiratory events: There were 8 hypopneas with a hypopnea index of 1.5 /hour. The patient also had 2 respiratory event related arousals (RERAs).     The total APNEA/HYPOPNEA INDEX (AHI) was 1.5 /hour and the total RESPIRATORY DISTURBANCE INDEX was 1.9 /hour.  2 events occurred in REM sleep and 6 events in NREM. The REM AHI was 0.7 /hour versus a non-REM AHI of 2.6 /hour.  REM sleep was achieved on a pressure of 10 cm/h2o (AHI was 4.8/h.) The patient spent 19% of total sleep time in the supine position. The supine AHI was 1.0 /hour, versus a non-supine AHI of 1.6/hour.  OXYGEN SATURATION & C02:  The wake baseline 02 saturation was 96%, with the lowest being 82%. Time spent below 89% saturation equaled 14 minutes.  PERIODIC LIMB MOVEMENTS: The arousals were noted as: 19 were spontaneous, 0 were associated with PLMs, and 7 were associated with respiratory events. The patient had a total of 0 Periodic Limb Movements.  Post-study, the patient indicated that sleep was the same as usual.  POLYSOMNOGRAPHY IMPRESSION:   1. Severest Obstructive Sleep Hypopnea/ UARS and Apnea  (OSA) with an AHI of 110.5/h, responding to CPAP at 14 cm water pressure. 2. Loud Snoring.  RECOMMENDATIONS:  Autotitration capable CPAP device with heated humidity set to a pressure window of 7-16 cm water pressure with 3 cm EPR. A Full Face Mask was used due to patient nasal congestion, the ResMed model AirFit F 30 in medium size. Allow patient to choose interface of his comfort.  A follow up appointment will be scheduled in the Sleep Clinic at [facility].     I certify that I have reviewed the entire raw data recording prior to the issuance of this report in accordance with the Standards of Accreditation of the American Academy of Sleep Medicine (AASM)   Larey Seat, M.D.  05-08-2018 Diplomat, American Board of Psychiatry and Neurology  Diplomat, [place] of Sleep Medicine Medical Director, [place]  Sleep at [facility]

## 2018-05-08 NOTE — Addendum Note (Signed)
Addended by: Melvyn Novas on: 05/08/2018 11:58 AM   Modules accepted: Orders

## 2018-05-11 ENCOUNTER — Telehealth: Payer: Self-pay | Admitting: Neurology

## 2018-05-11 NOTE — Telephone Encounter (Signed)
Called patient to discuss sleep study results. No answer at this time. LVM for the patient to call back.   

## 2018-05-11 NOTE — Telephone Encounter (Signed)
-----   Message from Melvyn Novas, MD sent at 05/08/2018 11:58 AM EST ----- POLYSOMNOGRAPHY IMPRESSION:   1. Severest Obstructive Sleep Hypopnea/ UARS and Apnea (OSA) with  an AHI of 110.5/h, responding to CPAP at 14 cm water pressure. 2. Loud Snoring.  RECOMMENDATIONS: Autotitration capable CPAP device with heated  humidity set to a pressure window of 7-16 cm water pressure with  3 cm EPR. A Full Face Mask was used due to patient nasal  congestion, the ResMed model AirFit F 30 in medium size. Allow  patient to choose interface of his comfort.

## 2018-05-12 NOTE — Telephone Encounter (Signed)
I called pt. I advised pt that Dr. Vickey Huger reviewed their sleep study results and found that pt has severe sleep apnea. Dr. Vickey Huger recommends that pt starts auto CPAP 7-16 cm water pressure. I reviewed PAP compliance expectations with the pt. Pt is agreeable to starting a CPAP. I advised pt that an order will be sent to a DME, [facility], and [facility] will call the pt within about one week after they file with the pt's insurance. [facility] will show the pt how to use the machine, fit for masks, and troubleshoot the CPAP if needed. A follow up appt was made for insurance purposes with George Hugh, NP on April 16,2020 at 7:45 am. Pt verbalized understanding to arrive 15 minutes early and bring their CPAP. A letter with all of this information in it will be mailed to the pt as a reminder. I verified with the pt that the address we have on file is correct. Pt verbalized understanding of results. Pt had no questions at this time but was encouraged to call back if questions arise. I have sent the order to [facility] and have received confirmation that they have received the order.

## 2018-07-06 ENCOUNTER — Other Ambulatory Visit: Payer: Self-pay | Admitting: Neurology

## 2018-07-06 DIAGNOSIS — J449 Chronic obstructive pulmonary disease, unspecified: Principal | ICD-10-CM

## 2018-07-06 DIAGNOSIS — G4733 Obstructive sleep apnea (adult) (pediatric): Secondary | ICD-10-CM

## 2018-07-07 ENCOUNTER — Telehealth: Payer: Self-pay | Admitting: Adult Health

## 2018-07-07 NOTE — Telephone Encounter (Addendum)
LVM to discuss changing visit to a video

## 2018-07-13 ENCOUNTER — Telehealth: Payer: Self-pay | Admitting: *Deleted

## 2018-07-13 NOTE — Telephone Encounter (Signed)
Called Kenneth Koch. Consented to VV with Shawnie Dapper, NP for cpap.  .Due to current COVID 19 pandemic, our office is severely reducing in office visits for at least the next 2 weeks, in order to minimize the risk to our patients and healthcare providers.  Kenneth Koch understands that although there may be some limitations with this type of visit, we will take all precautions to reduce any security or privacy concerns.  Kenneth Koch understands that this will be treated like an in office visit and we will file with Kenneth Koch's insurance.  Kenneth Koch's email is [email or web address]. Kenneth Koch understands that the cisco webex software must be downloaded and operational on the device Kenneth Koch plans to use for the visit. Chart updated.

## 2018-07-16 ENCOUNTER — Encounter: Payer: Self-pay | Admitting: Family Medicine

## 2018-07-16 ENCOUNTER — Ambulatory Visit (INDEPENDENT_AMBULATORY_CARE_PROVIDER_SITE_OTHER): Payer: 59 | Admitting: Family Medicine

## 2018-07-16 ENCOUNTER — Other Ambulatory Visit: Payer: Self-pay

## 2018-07-16 DIAGNOSIS — G4733 Obstructive sleep apnea (adult) (pediatric): Secondary | ICD-10-CM | POA: Diagnosis not present

## 2018-07-16 DIAGNOSIS — J449 Chronic obstructive pulmonary disease, unspecified: Secondary | ICD-10-CM | POA: Diagnosis not present

## 2018-07-16 NOTE — Progress Notes (Signed)
PATIENT: Kenneth Koch DOB: Jan 03, 1966  REASON FOR VISIT: follow up HISTORY FROM: patient  Virtual Visit via Telephone Note  I connected with Kenneth Koch on 07/16/18 at  7:45 AM EDT by telephone and verified that I am speaking with the correct person using two identifiers.   I discussed the limitations, risks, security and privacy concerns of performing an evaluation and management service by telephone and the availability of in person appointments. I also discussed with the patient that there may be a patient responsible charge related to this service. The patient expressed understanding and agreed to proceed.   History of Present Illness:  07/16/18 Kenneth Koch is a 53 y.o. male for follow up of sleep apnea with new CPAP therapy. Sleep study revealed severe sleep apnea with approx 110 apnea events per hour.  Kenneth Koch reports that he is feeling significantly better.  He has noticed that he has much more energy throughout the day.  He is resting much better.  He wakes feeling refreshed.  He states that this therapy has been life-changing.  Download reveals the following:  06/15/2018 - 07/14/2018 16/2020 - 07/14/2018 Usage days 30/30 days (100%) >= 4 hours 29 days (97%) < 4 hours 1 days (3%) Usage hours 184 hours 40 minutes Average usage (total days) 6 hours 9 minutes Average usage (days used) 6 hours 9 minutes Median usage (days used) 6 hours 21 minutes Total used hours (value since last reset - 07/14/2018) 272 hours AirSense 10 AutoSet Serial number [id number] Mode AutoSet Min Pressure 7 cmH2O Max Pressure 16 cmH2O EPR Fulltime EPR level 3 Response Standard Therapy Pressure - cmH2O Median: 11.9 95th percentile: 14.6 Maximum: 15.6 Leaks - L/min Median: 22.8 95th percentile: 71.8 Maximum: 95.1 Events per hour AI: 17.5 HI: 4.5 AHI: 22.0 Apnea Index Central: 0.2 Obstructive: 3.8 Unknown: 13.4 RERA Index 1.6 Cheyne-Stokes respiration (average duration  per night) 0 minutes (0%)   History (copied from Dr Edwena Felty note on 04/09/2018)  HPI:  Kenneth Koch is a 53 y.o. male patient  , seen here as in a referral  from Dr. Dagmar Hait for a new sleep evaluation.  I had the pleasure of seeing him today on 09 April 2018, the last time we met was on 16 June 2008 and a sleep consultation.  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  The study was split as his apnea index exceeded 100 an hour.  CPAP was initiated but cost central apneas to emerge.  BiPAP was therefore used and the best result was achieved at 19/15 centimeters water pressure.  The patient did initially use the machine the longest time he could tolerated however was only about 3 hours at night.  He felt that he got some sleep quality but it bothered him in the early morning hours to have the interface and his sleep became more and more fragmented after that we also discussed his alcohol intake at the time close to bedtime, having a possible impact.  He had also overcome a recent ear infection and a viral cold but may have made it harder for him to generate positive airway pressure.  He endorsed the Epworth sleepiness score at 14 points which was an elevated level of daytime sleepiness.  In the meantime his daytime sleepiness has exacerbated, he continues to smoke, he has not lost a significant  amount of weight, and his work is now affected by his daytime sleepiness.  His primary care physician sent him back as he now has a body mass index exceeding 35.94 kg/m, has almost constant wheezing and shortness of breath even with minimal exertion, he reports disruptive sleep with significant snoring now. EPWORTH SLEEPINESS SCORE is 21/ 254 points !  FSS 36/63.    Sleep habits are as follows: Dinner time averages 8 Pm, and he goes to  bed at midnight, in a cold, quiet and dark  bedroom.   He sleeps on a stack of 2 pillowes with elevated head of bed. Dreams well.   Sleep medical history: frequent Colds, but not bronchitis, morning phlegm with coughing.  Borderline HTN. Tonsils still in situ, " spot on the tongue " biopsy negative. He stated he is not having emphysema. Retrognathia. Nicotine dependence.   Family sleep history: father is a loud snorer.   Social history: Separated after 42 years of marriage, likely to end in divorce, 2 sons age 30 and 73 years old.  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    Observations/Objective:  Generalized: Well developed, in no acute distress  Mentation: Alert oriented to time, place, history taking. Follows all commands speech and language fluent   Assessment and Plan:  53 y.o. year old male  has a past medical history of Hypertension and Sleep apnea. 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  Download report reveals excellent compliance.  He feels that this therapy has been life-changing.  We have discussed the need for continued daily use and for greater than 4 hours each night.  We have also discussed elevated AHI at 22 events per hour.  Previous AHI was greater than 110 events per hour.  I have advised that we continue with current settings.  We will repeat download in evaluation in 6 to 8 weeks.  I have discussed with Kenneth Koch the need to consider a change in settings should AHI remain elevated.  He denies the need for supplies at this time.  He verbalizes understanding and agreement with this plan.   No orders of the defined types were placed in this encounter.   No orders of the defined types were placed in this encounter.    Follow Up Instructions:  I discussed the assessment and treatment plan with the patient. The patient was provided an opportunity to ask questions and all were  answered. The patient agreed with the plan and demonstrated an understanding of the instructions.   The patient was advised to call back or seek an in-person evaluation if the symptoms worsen or if the condition fails to improve as anticipated.  I provided 25 minutes of non-face-to-face time during this encounter.  Patient located at his place of residence during video conference.  Provider located at her place of residence.  Liane Comber, RN help to facilitate visit.   Debbora Presto, NP

## 2018-10-19 ENCOUNTER — Telehealth: Payer: Self-pay | Admitting: Family Medicine

## 2018-10-19 ENCOUNTER — Ambulatory Visit: Payer: Self-pay | Admitting: Family Medicine

## 2018-10-19 NOTE — Telephone Encounter (Signed)
I have attempted to reach patient for 8:30 appt on Doxy via Doxy site as well as telephone number provided in chart. No answer, patient not logged in. Left voicemail for patient to return call to reschedule visit.

## 2021-06-08 LAB — COLOGUARD: COLOGUARD: NEGATIVE
